# Patient Record
Sex: Female | Born: 1980 | Hispanic: No | Marital: Single
Health system: Southern US, Community
[De-identification: ages and names within clinical notes are randomized; demographics above are authoritative.]

---

## 2018-08-03 ENCOUNTER — Emergency Department: Payer: No Typology Code available for payment source

## 2018-08-03 ENCOUNTER — Emergency Department
Admission: EM | Admit: 2018-08-03 | Discharge: 2018-08-03 | Disposition: A | Discharge status: Home / Self Care | Payer: No Typology Code available for payment source | Attending: Emergency Medicine | Admitting: Emergency Medicine

## 2018-08-03 ENCOUNTER — Encounter: Payer: Self-pay | Admitting: Emergency Medicine

## 2018-08-03 ENCOUNTER — Other Ambulatory Visit: Payer: Self-pay

## 2018-08-03 DIAGNOSIS — M25562 Pain in left knee: Secondary | ICD-10-CM | POA: Insufficient documentation

## 2018-08-03 DIAGNOSIS — F419 Anxiety disorder, unspecified: Secondary | ICD-10-CM | POA: Diagnosis not present

## 2018-08-03 DIAGNOSIS — M25561 Pain in right knee: Secondary | ICD-10-CM | POA: Diagnosis not present

## 2018-08-03 DIAGNOSIS — M79641 Pain in right hand: Secondary | ICD-10-CM | POA: Diagnosis present

## 2018-08-03 DIAGNOSIS — F410 Panic disorder [episodic paroxysmal anxiety] without agoraphobia: Secondary | ICD-10-CM

## 2018-08-03 DIAGNOSIS — M542 Cervicalgia: Secondary | ICD-10-CM | POA: Diagnosis not present

## 2018-08-03 MED ORDER — IBUPROFEN 400 MG PO TABS
400.0000 mg | ORAL_TABLET | Freq: Once | ORAL | Status: AC
  Administered 2018-08-03: 400 mg via ORAL
  Filled 2018-08-03: qty 1
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
## 2018-08-03 NOTE — ED Notes (Signed)
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
